# Patient Record
Sex: Male | Born: 1995 | Race: White | Hispanic: No | Marital: Single | Smoking: Never smoker
Health system: Southern US, Community
[De-identification: ages and names within clinical notes are randomized; demographics above are authoritative.]

## PROBLEM LIST (undated history)

## (undated) DIAGNOSIS — Z8669 Personal history of other diseases of the nervous system and sense organs: Secondary | ICD-10-CM

## (undated) DIAGNOSIS — F845 Asperger's syndrome: Secondary | ICD-10-CM

## (undated) DIAGNOSIS — Z8659 Personal history of other mental and behavioral disorders: Secondary | ICD-10-CM

## (undated) DIAGNOSIS — F633 Trichotillomania: Secondary | ICD-10-CM

## (undated) HISTORY — DX: Asperger's syndrome: F84.5

## (undated) HISTORY — DX: Personal history of other diseases of the nervous system and sense organs: Z86.69

## (undated) HISTORY — DX: Personal history of other mental and behavioral disorders: Z86.59

## (undated) HISTORY — DX: Trichotillomania: F63.3

## (undated) HISTORY — PX: OTHER SURGICAL HISTORY: SHX169

---
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
## 2015-03-01 IMAGING — CR DG FOREARM 2V*R*
2 series · 2 of 2 positions shown · non-contrast
Comparison: None.

CLINICAL DATA: Laceration to distal wrist.

EXAM:
RIGHT FOREARM - 2 VIEW

[x forearm ap right]
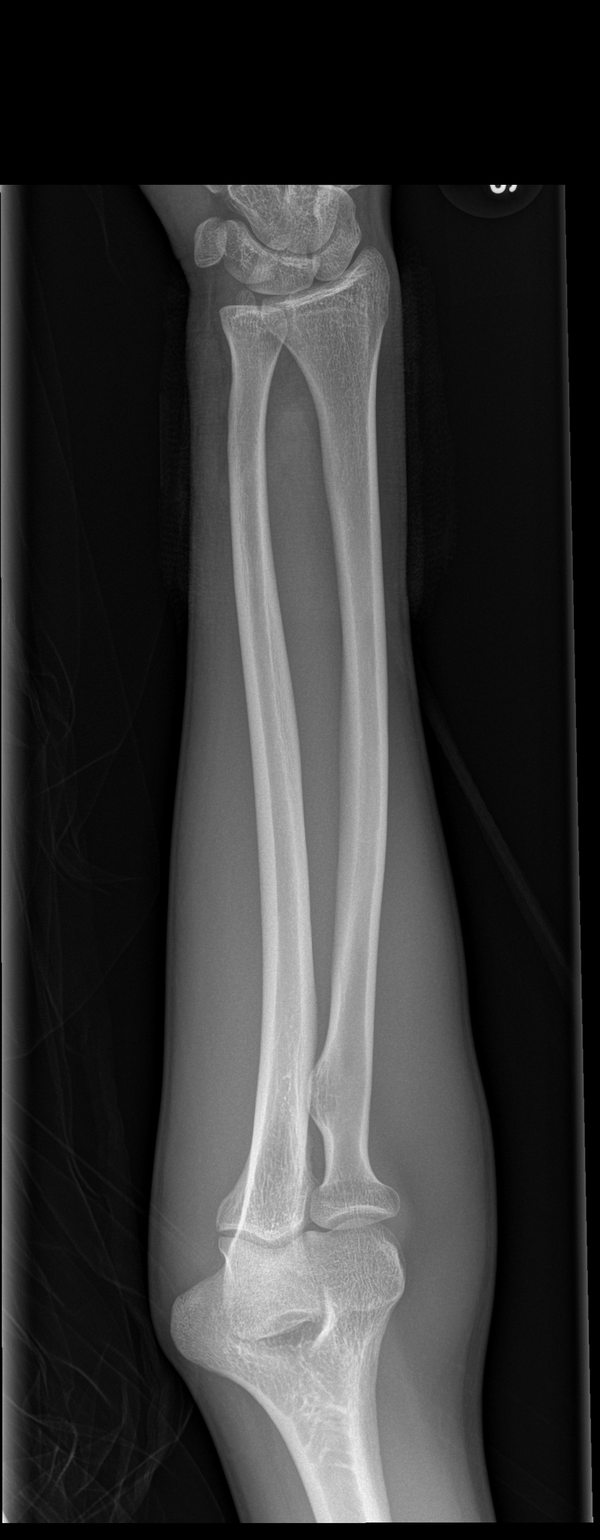

[x forearm lat right]
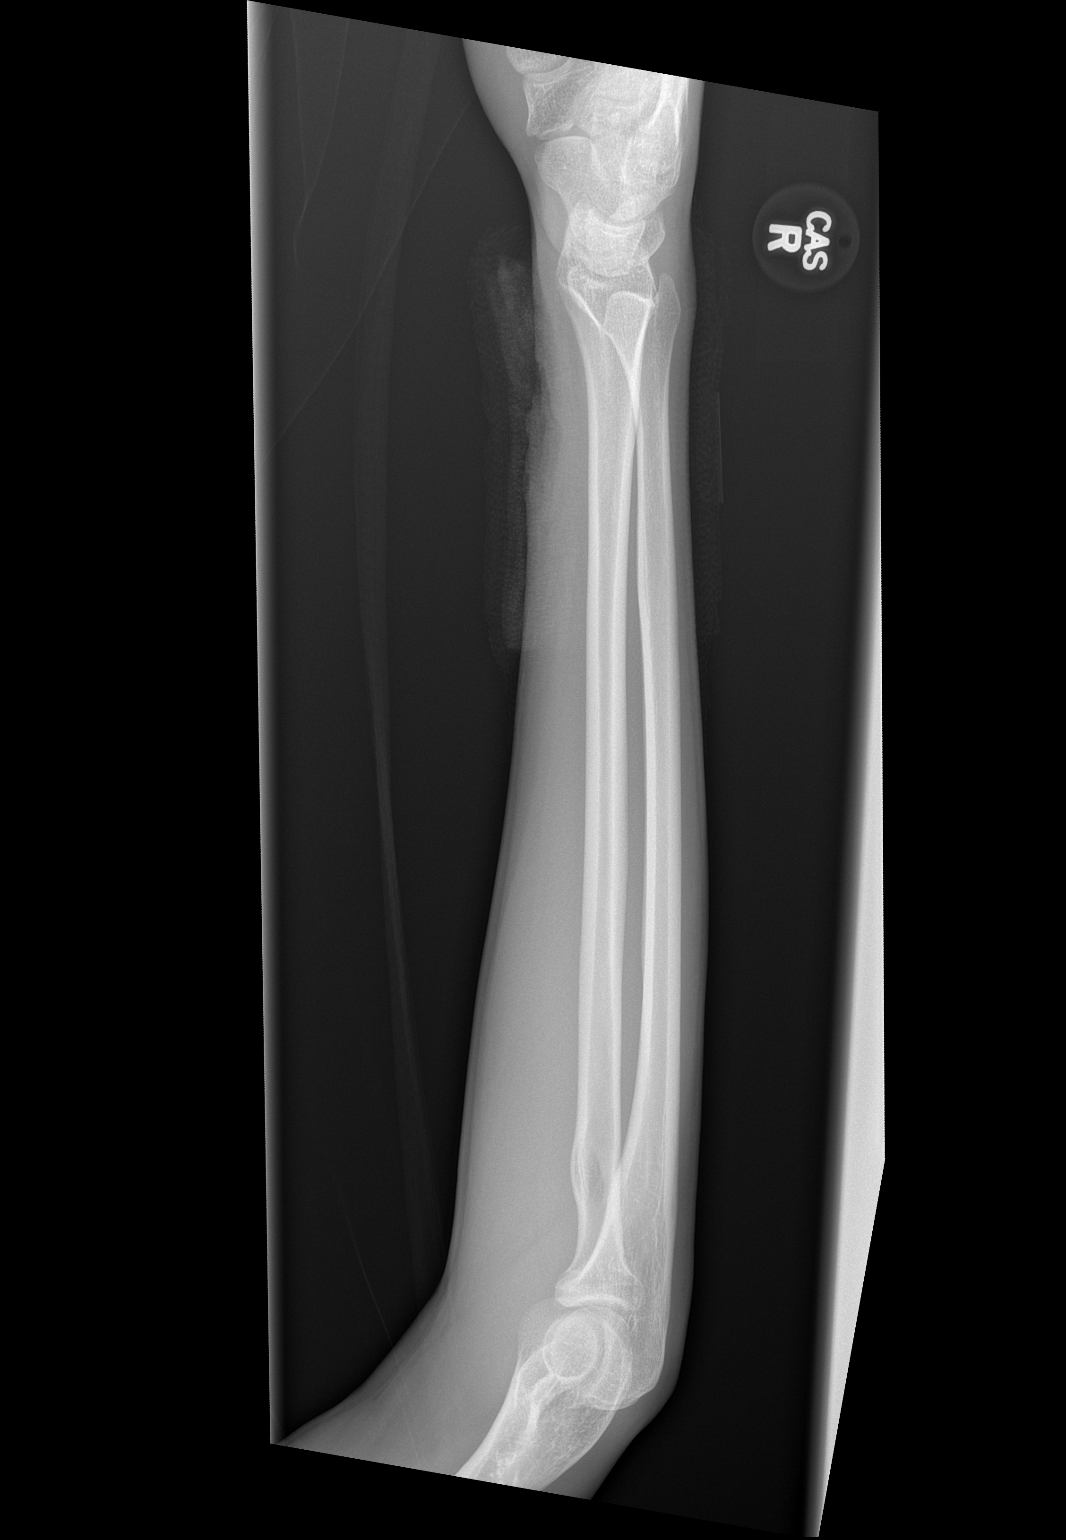

[2 of 2 positions shown; findings below may reference images not displayed]

FINDINGS: Soft tissue laceration noted anteriorly in the distal forearm and
wrist region. No underlying acute bony abnormality. No fracture,
subluxation or dislocation. No visible soft tissue radiopaque
foreign body.
IMPRESSION: Soft tissue laceration anteriorly in the distal forearm and wrist
region. No acute bony abnormality.

## 2015-03-01 IMAGING — CR DG FINGER LITTLE 2+V*R*
3 series · 3 of 3 positions shown · non-contrast
Comparison: None.

CLINICAL DATA: Punched a window.  Extremity laceration.

EXAM:
RIGHT LITTLE FINGER 2+V

[x finger pa right]
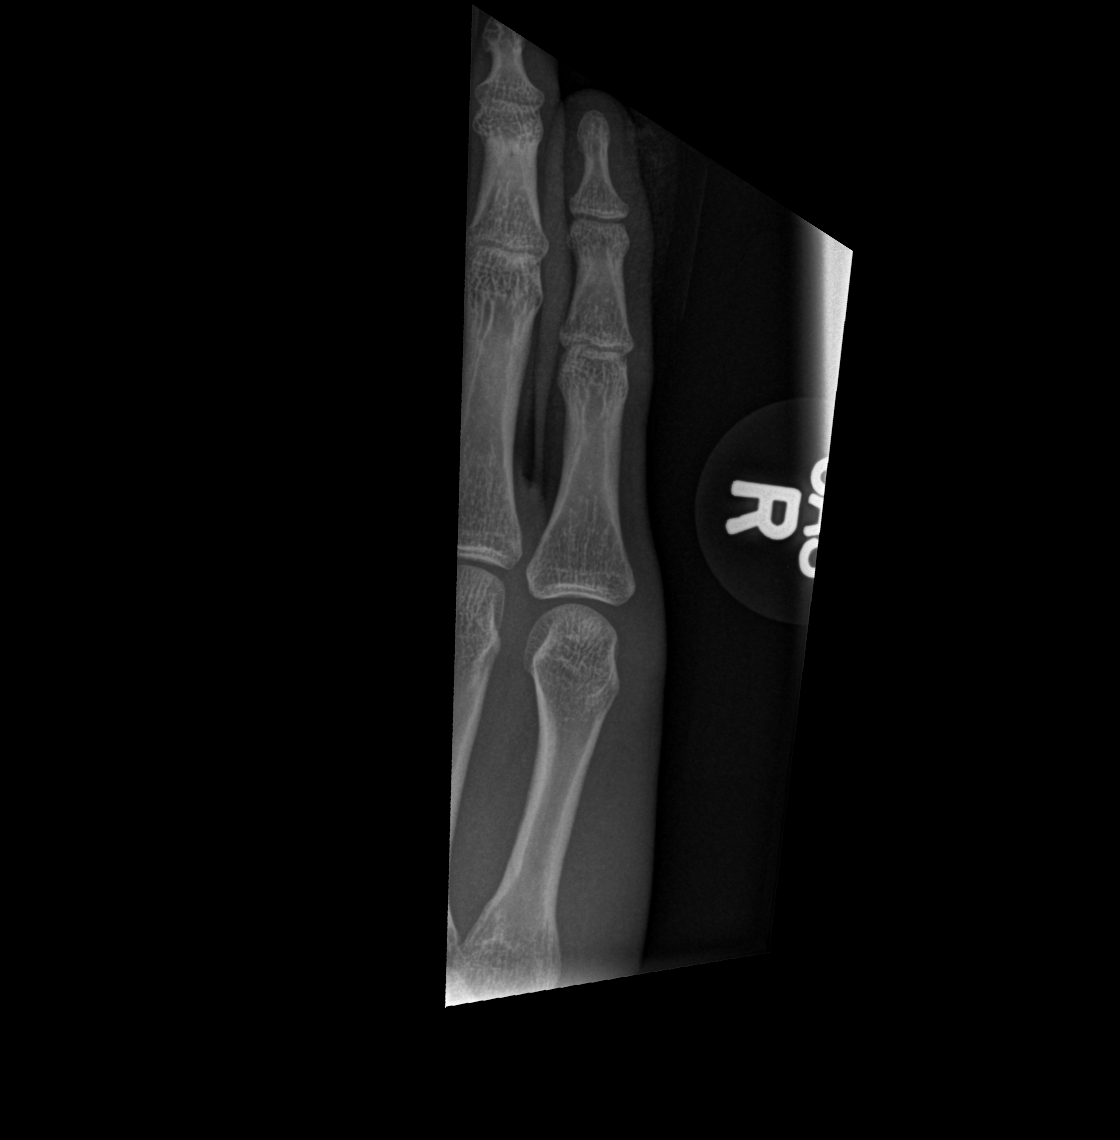

[x finger obl right]
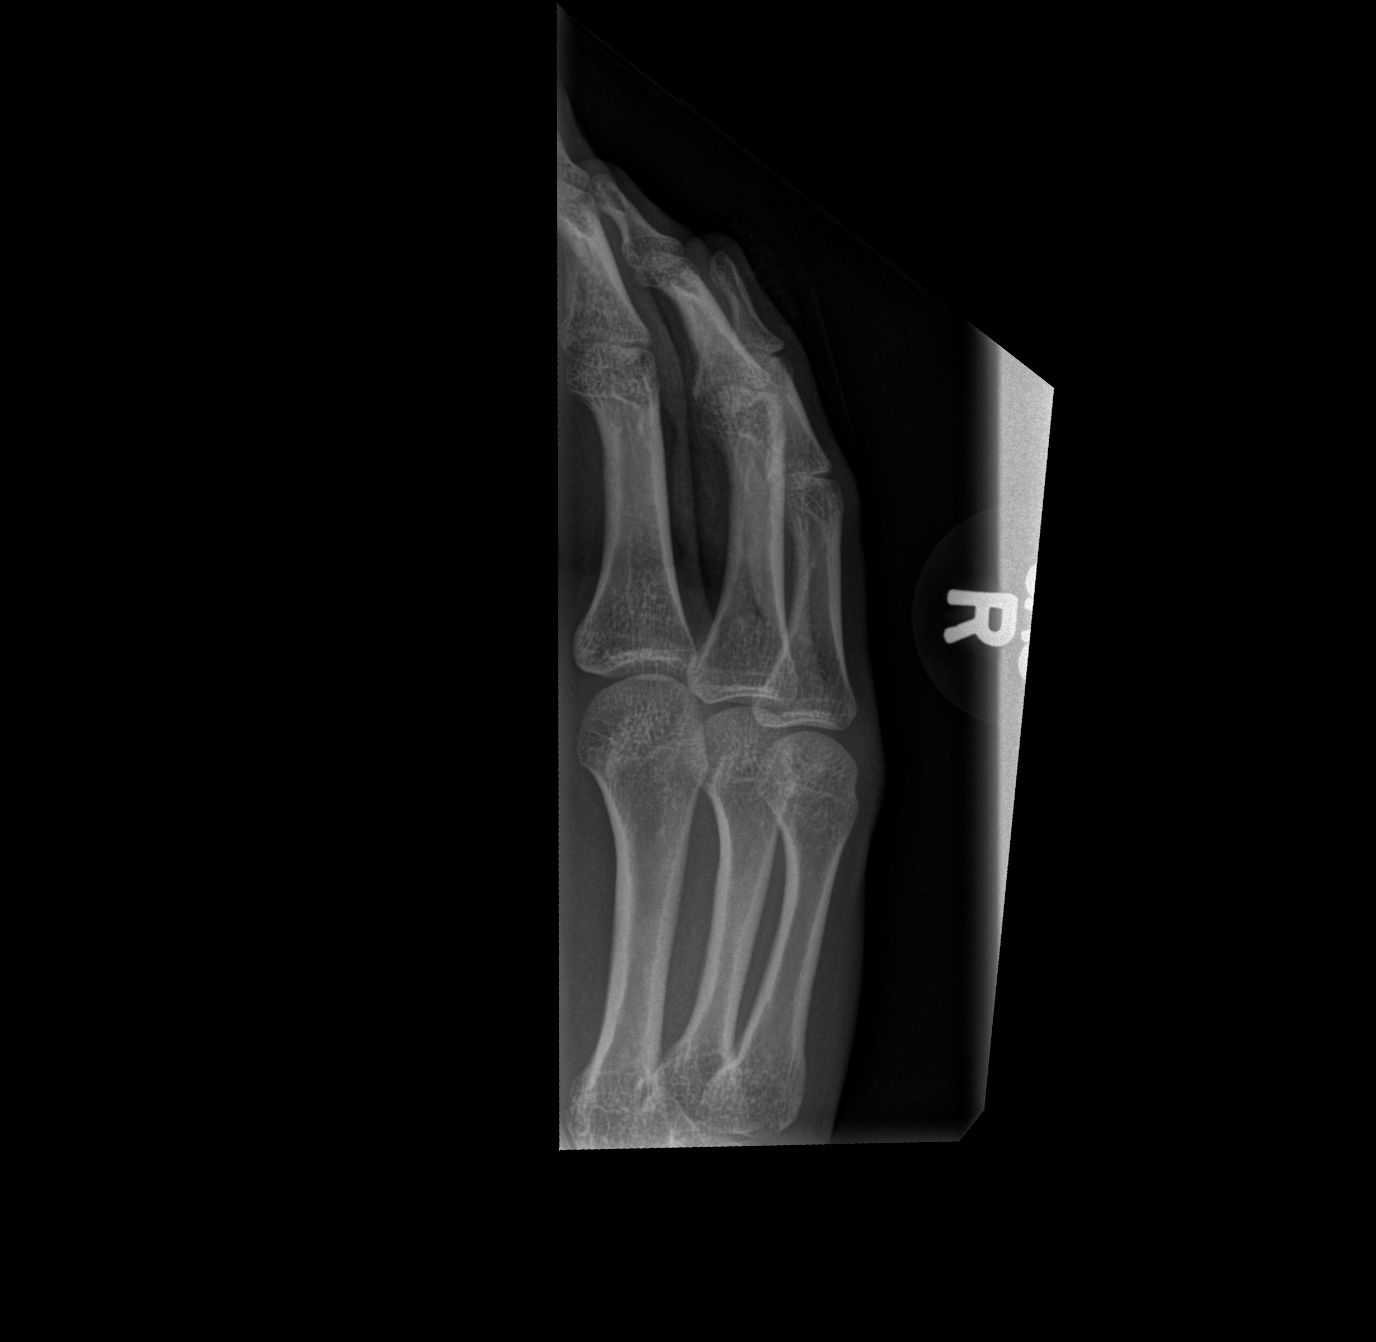

[x finger lat right]
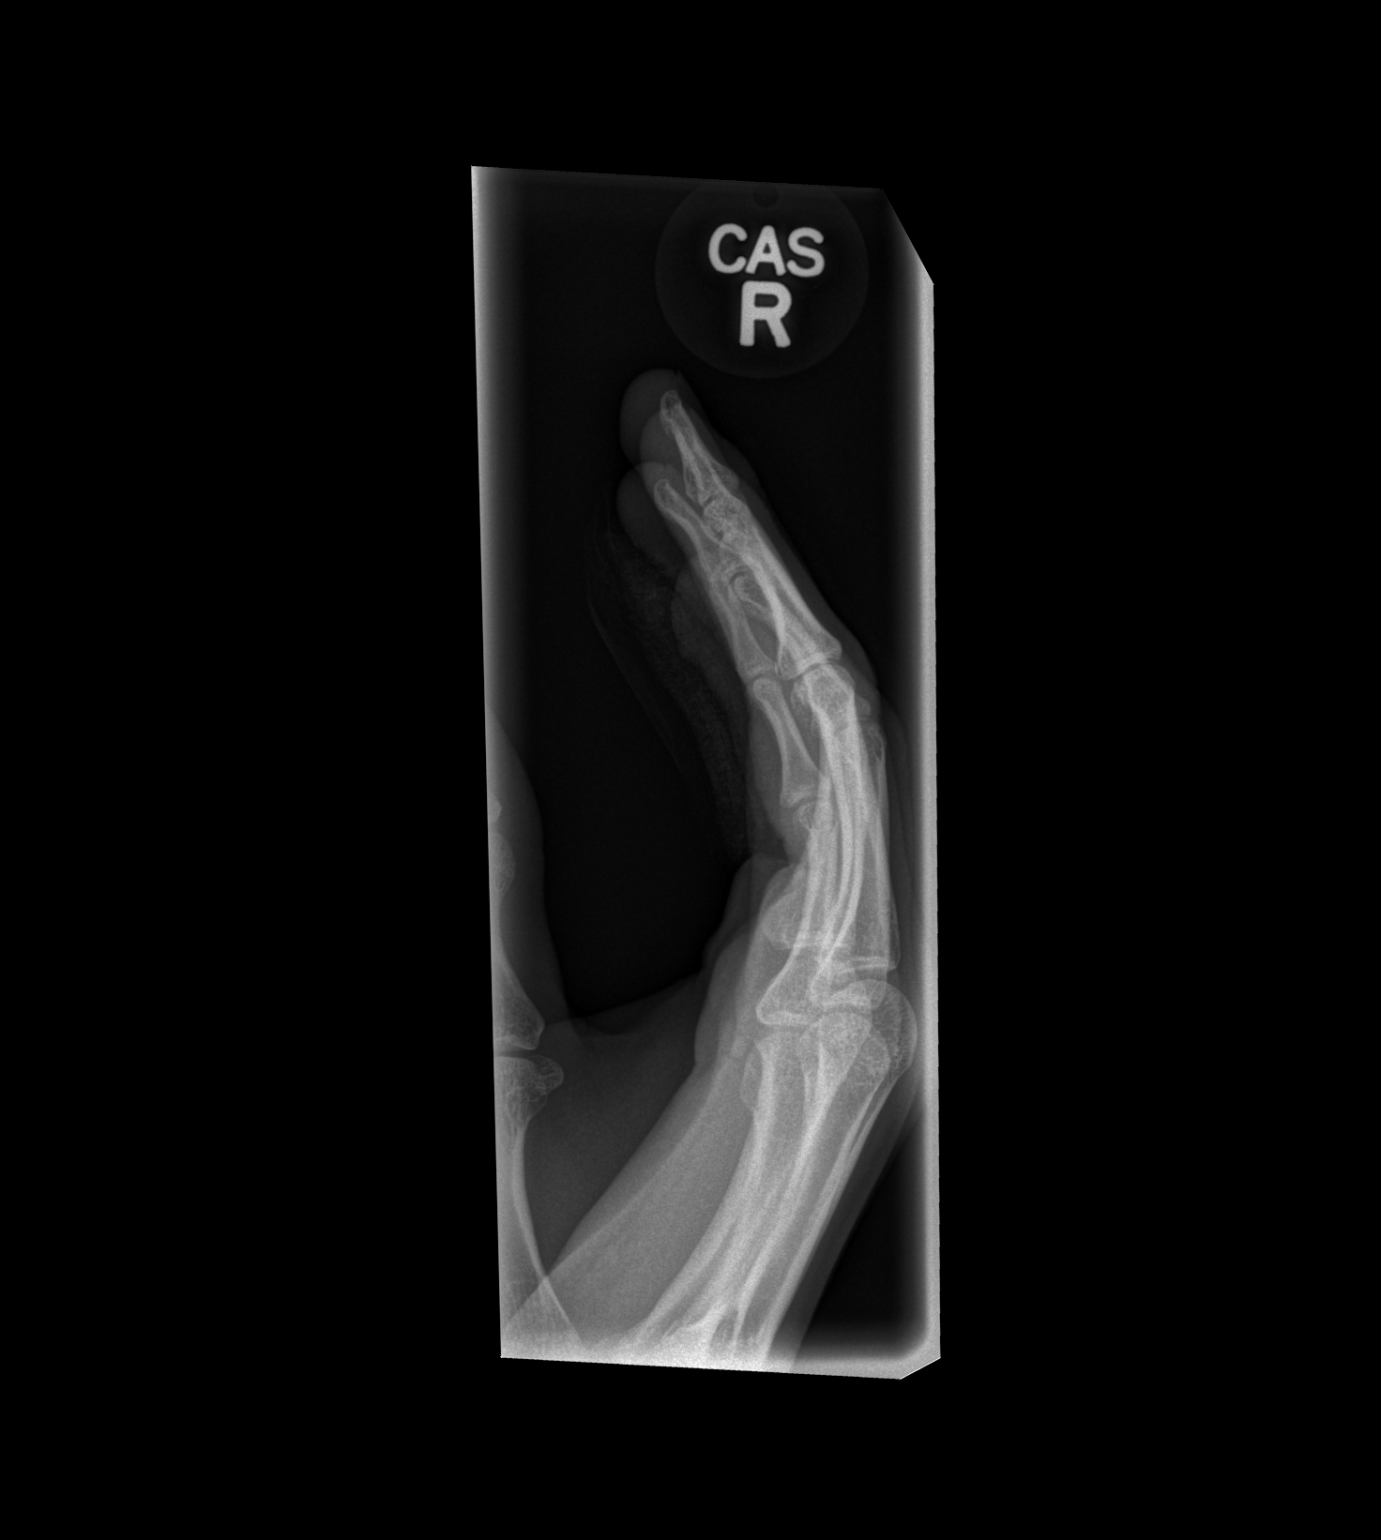

[3 of 3 positions shown; findings below may reference images not displayed]

FINDINGS: No fracture, subluxation or dislocation. No radiopaque foreign
bodies within the soft tissues.
IMPRESSION: No fracture or foreign body.

## 2019-02-26 ENCOUNTER — Ambulatory Visit: Payer: 59 | Attending: Internal Medicine

## 2019-02-26 DIAGNOSIS — Z20822 Contact with and (suspected) exposure to covid-19: Secondary | ICD-10-CM | POA: Insufficient documentation

## 2019-02-27 LAB — NOVEL CORONAVIRUS, NAA: SARS-CoV-2, NAA: NOT DETECTED

## 2019-04-24 ENCOUNTER — Ambulatory Visit: Payer: 59 | Attending: Internal Medicine

## 2019-04-24 DIAGNOSIS — Z20822 Contact with and (suspected) exposure to covid-19: Secondary | ICD-10-CM

## 2019-04-25 LAB — SARS-COV-2, NAA 2 DAY TAT

## 2019-04-25 LAB — NOVEL CORONAVIRUS, NAA: SARS-CoV-2, NAA: NOT DETECTED

## 2019-05-02 ENCOUNTER — Ambulatory Visit: Payer: 59 | Attending: Internal Medicine

## 2019-05-02 DIAGNOSIS — Z23 Encounter for immunization: Secondary | ICD-10-CM

## 2019-05-02 NOTE — Progress Notes (Signed)
   Covid-19 Vaccination Clinic  Name:  Christopher Mcconnell    MRN: [id number] DOB: 1995/07/29  05/02/2019  Christopher Mcconnell was observed post Covid-19 immunization for 15 minutes without incident. He was provided with Vaccine Information Sheet and instruction to access the V-Safe system.   Christopher Mcconnell was instructed to call 911 with any severe reactions post vaccine: Marland Kitchen Difficulty breathing  . Swelling of face and throat  . A fast heartbeat  . A bad rash all over body  . Dizziness and weakness   Immunizations Administered    Name Date Dose VIS Date Route   Pfizer COVID-19 Vaccine 05/02/2019 10:15 AM 0.3 mL 01/11/2019 Intramuscular   Manufacturer: [employer], [employer]   Lot: [id number]   NDC: [address]-6

## 2019-05-27 ENCOUNTER — Ambulatory Visit: Payer: 59 | Attending: Internal Medicine

## 2019-05-27 DIAGNOSIS — Z23 Encounter for immunization: Secondary | ICD-10-CM

## 2019-05-27 NOTE — Progress Notes (Signed)
   Covid-19 Vaccination Clinic  Name:  Christopher Mcconnell    MRN: [id number] DOB: 12/23/1995  05/27/2019  [name] was observed post Covid-19 immunization for 15 minutes without incident. He was provided with Vaccine Information Sheet and instruction to access the V-Safe system.   [name] was instructed to call 911 with any severe reactions post vaccine: Marland Kitchen Difficulty breathing  . Swelling of face and throat  . A fast heartbeat  . A bad rash all over body  . Dizziness and weakness   Immunizations Administered    Name Date Dose VIS Date Route   Pfizer COVID-19 Vaccine 05/27/2019 10:04 AM 0.3 mL 03/27/2018 Intramuscular   Manufacturer: [employer], [employer]   Lot: [id number]   NDC: [address]-6
